# Patient Record
Sex: Male | Born: 1950 | Race: White | Hispanic: No | Marital: Married | Smoking: Never smoker
Health system: Southern US, Community
[De-identification: ages and names within clinical notes are randomized; demographics above are authoritative.]

## PROBLEM LIST (undated history)

## (undated) DIAGNOSIS — M109 Gout, unspecified: Secondary | ICD-10-CM

## (undated) DIAGNOSIS — E119 Type 2 diabetes mellitus without complications: Secondary | ICD-10-CM

## (undated) DIAGNOSIS — I10 Essential (primary) hypertension: Secondary | ICD-10-CM

## (undated) DIAGNOSIS — Z972 Presence of dental prosthetic device (complete) (partial): Secondary | ICD-10-CM

## (undated) DIAGNOSIS — E785 Hyperlipidemia, unspecified: Secondary | ICD-10-CM

## (undated) HISTORY — PX: HEMORRHOID SURGERY: SHX153

---

## 2006-06-13 ENCOUNTER — Emergency Department: Payer: Self-pay | Admitting: Emergency Medicine

## 2006-06-14 ENCOUNTER — Emergency Department: Payer: Self-pay | Admitting: Emergency Medicine

## 2006-06-15 ENCOUNTER — Inpatient Hospital Stay: Payer: Self-pay

## 2014-04-24 ENCOUNTER — Ambulatory Visit: Payer: Self-pay | Admitting: Unknown Physician Specialty

## 2014-04-26 LAB — PATHOLOGY REPORT

## 2015-01-15 ENCOUNTER — Other Ambulatory Visit: Payer: Self-pay | Admitting: Family Medicine

## 2015-01-15 DIAGNOSIS — R7402 Elevation of levels of lactic acid dehydrogenase (LDH): Secondary | ICD-10-CM

## 2015-01-15 DIAGNOSIS — R740 Nonspecific elevation of levels of transaminase and lactic acid dehydrogenase [LDH]: Principal | ICD-10-CM

## 2015-02-07 ENCOUNTER — Ambulatory Visit
Admission: RE | Admit: 2015-02-07 | Discharge: 2015-02-07 | Disposition: A | Discharge status: Home / Self Care | Payer: No Typology Code available for payment source | Source: Ambulatory Visit | Attending: Family Medicine | Admitting: Family Medicine

## 2015-02-07 DIAGNOSIS — R7401 Elevation of levels of liver transaminase levels: Secondary | ICD-10-CM

## 2015-02-07 DIAGNOSIS — E669 Obesity, unspecified: Secondary | ICD-10-CM | POA: Diagnosis present

## 2015-02-07 DIAGNOSIS — R740 Nonspecific elevation of levels of transaminase and lactic acid dehydrogenase [LDH]: Secondary | ICD-10-CM | POA: Diagnosis not present

## 2020-08-20 ENCOUNTER — Other Ambulatory Visit: Payer: Self-pay | Admitting: Family Medicine

## 2020-08-20 DIAGNOSIS — Z87891 Personal history of nicotine dependence: Secondary | ICD-10-CM

## 2020-08-30 ENCOUNTER — Ambulatory Visit
Admission: RE | Admit: 2020-08-30 | Discharge: 2020-08-30 | Disposition: A | Discharge status: Home / Self Care | Payer: Medicare (Managed Care) | Source: Ambulatory Visit | Attending: Family Medicine | Admitting: Family Medicine

## 2020-08-30 ENCOUNTER — Other Ambulatory Visit: Payer: Self-pay

## 2020-08-30 DIAGNOSIS — Z136 Encounter for screening for cardiovascular disorders: Secondary | ICD-10-CM | POA: Insufficient documentation

## 2020-08-30 DIAGNOSIS — Z87891 Personal history of nicotine dependence: Secondary | ICD-10-CM | POA: Insufficient documentation

## 2021-10-07 ENCOUNTER — Encounter: Payer: Self-pay | Admitting: Ophthalmology

## 2021-10-17 NOTE — Discharge Instructions (Signed)

## 2021-10-21 ENCOUNTER — Encounter: Payer: Self-pay | Admitting: Ophthalmology

## 2021-10-21 ENCOUNTER — Ambulatory Visit
Admission: RE | Admit: 2021-10-21 | Discharge: 2021-10-21 | Disposition: A | Discharge status: Home / Self Care | Payer: Medicare Other | Attending: Ophthalmology | Admitting: Ophthalmology

## 2021-10-21 ENCOUNTER — Ambulatory Visit: Payer: Medicare Other | Admitting: Anesthesiology

## 2021-10-21 ENCOUNTER — Other Ambulatory Visit: Payer: Self-pay

## 2021-10-21 ENCOUNTER — Encounter
Admission: RE | Disposition: A | Discharge status: Home / Self Care | Payer: Self-pay | Source: Home / Self Care | Attending: Ophthalmology

## 2021-10-21 DIAGNOSIS — I10 Essential (primary) hypertension: Secondary | ICD-10-CM | POA: Diagnosis not present

## 2021-10-21 DIAGNOSIS — E1136 Type 2 diabetes mellitus with diabetic cataract: Secondary | ICD-10-CM | POA: Diagnosis present

## 2021-10-21 DIAGNOSIS — H2511 Age-related nuclear cataract, right eye: Secondary | ICD-10-CM | POA: Diagnosis not present

## 2021-10-21 HISTORY — DX: Presence of dental prosthetic device (complete) (partial): Z97.2

## 2021-10-21 HISTORY — DX: Gout, unspecified: M10.9

## 2021-10-21 HISTORY — DX: Type 2 diabetes mellitus without complications: E11.9

## 2021-10-21 HISTORY — PX: CATARACT EXTRACTION W/PHACO: SHX586

## 2021-10-21 HISTORY — DX: Hyperlipidemia, unspecified: E78.5

## 2021-10-21 HISTORY — DX: Essential (primary) hypertension: I10

## 2021-10-21 LAB — GLUCOSE, CAPILLARY
Glucose-Capillary: 131 mg/dL — ABNORMAL HIGH (ref 70–99)
Glucose-Capillary: 159 mg/dL — ABNORMAL HIGH (ref 70–99)

## 2021-10-21 SURGERY — PHACOEMULSIFICATION, CATARACT, WITH IOL INSERTION
Anesthesia: Monitor Anesthesia Care | Site: Eye | Laterality: Right

## 2021-10-21 MED ORDER — TETRACAINE HCL 0.5 % OP SOLN
1.0000 [drp] | OPHTHALMIC | Status: DC | PRN
  Administered 2021-10-21 (×3): 1 [drp] via OPHTHALMIC

## 2021-10-21 MED ORDER — FENTANYL CITRATE (PF) 100 MCG/2ML IJ SOLN
INTRAMUSCULAR | Status: DC | PRN
Start: 2021-10-21 — End: 2021-10-21
  Administered 2021-10-21: 50 ug via INTRAVENOUS

## 2021-10-21 MED ORDER — ACETAMINOPHEN 325 MG PO TABS: 325.0000 mg | ORAL_TABLET | ORAL | Status: DC | PRN

## 2021-10-21 MED ORDER — SIGHTPATH DOSE#1 SODIUM HYALURONATE 23 MG/ML IO SOLUTION
PREFILLED_SYRINGE | INTRAOCULAR | Status: DC | PRN
  Administered 2021-10-21: 0.6 mL via INTRAOCULAR

## 2021-10-21 MED ORDER — LACTATED RINGERS IV SOLN: INTRAVENOUS | Status: DC

## 2021-10-21 MED ORDER — LIDOCAINE HCL (PF) 2 % IJ SOLN
INTRAOCULAR | Status: DC | PRN
  Administered 2021-10-21: 1 mL via INTRAOCULAR

## 2021-10-21 MED ORDER — MOXIFLOXACIN HCL 0.5 % OP SOLN
OPHTHALMIC | Status: DC | PRN
  Administered 2021-10-21: 0.2 mL via OPHTHALMIC

## 2021-10-21 MED ORDER — SIGHTPATH DOSE#1 SODIUM HYALURONATE 10 MG/ML IO SOLUTION
PREFILLED_SYRINGE | INTRAOCULAR | Status: DC | PRN
  Administered 2021-10-21: 0.85 mL via INTRAOCULAR

## 2021-10-21 MED ORDER — ARMC OPHTHALMIC DILATING DROPS
1.0000 "application " | OPHTHALMIC | Status: DC | PRN
  Administered 2021-10-21 (×3): 1 via OPHTHALMIC

## 2021-10-21 MED ORDER — ACETAMINOPHEN 160 MG/5ML PO SOLN: 325.0000 mg | ORAL | Status: DC | PRN

## 2021-10-21 MED ORDER — SIGHTPATH DOSE#1 BSS IO SOLN
INTRAOCULAR | Status: DC | PRN
  Administered 2021-10-21: 67 mL via OPHTHALMIC

## 2021-10-21 MED ORDER — SIGHTPATH DOSE#1 BSS IO SOLN
INTRAOCULAR | Status: DC | PRN
  Administered 2021-10-21: 15 mL

## 2021-10-21 MED ORDER — MIDAZOLAM HCL 2 MG/2ML IJ SOLN
INTRAMUSCULAR | Status: DC | PRN
Start: 2021-10-21 — End: 2021-10-21
  Administered 2021-10-21: 2 mg via INTRAVENOUS

## 2021-10-21 SURGICAL SUPPLY — 20 items
CANNULA ANT/CHMB 27G (MISCELLANEOUS) IMPLANT
CANNULA ANT/CHMB 27GA (MISCELLANEOUS) IMPLANT
CATARACT SUITE SIGHTPATH (MISCELLANEOUS) ×3 IMPLANT
DISSECTOR HYDRO NUCLEUS 50X22 (MISCELLANEOUS) ×3 IMPLANT
FEE CATARACT SUITE SIGHTPATH (MISCELLANEOUS) ×1 IMPLANT
GLOVE SURG GAMMEX PI TX LF 7.5 (GLOVE) ×3 IMPLANT
GLOVE SURG SYN 8.5  E (GLOVE) ×2
GLOVE SURG SYN 8.5 E (GLOVE) ×1 IMPLANT
GLOVE SURG SYN 8.5 PF PI (GLOVE) ×1 IMPLANT
LENS IOL TECNIS EYHANCE 22.5 (Intraocular Lens) ×2 IMPLANT
NDL FILTER BLUNT 18X1 1/2 (NEEDLE) ×1 IMPLANT
NEEDLE FILTER BLUNT 18X 1/2SAF (NEEDLE) ×2
NEEDLE FILTER BLUNT 18X1 1/2 (NEEDLE) ×1 IMPLANT
PACK VIT ANT 23G (MISCELLANEOUS) IMPLANT
RING MALYGIN (MISCELLANEOUS) IMPLANT
SUT ETHILON 10-0 CS-B-6CS-B-6 (SUTURE)
SUTURE EHLN 10-0 CS-B-6CS-B-6 (SUTURE) IMPLANT
SYR 3ML LL SCALE MARK (SYRINGE) ×3 IMPLANT
SYR 5ML LL (SYRINGE) ×3 IMPLANT
WATER STERILE IRR 250ML POUR (IV SOLUTION) ×3 IMPLANT

## 2021-10-21 NOTE — Op Note (Signed)
OPERATIVE NOTE  Gary Snow [id number] 10/21/2021   PREOPERATIVE DIAGNOSIS:  Nuclear sclerotic cataract right eye.  H25.11   POSTOPERATIVE DIAGNOSIS:    Nuclear sclerotic cataract right eye.     PROCEDURE:  Phacoemusification with posterior chamber intraocular lens placement of the right eye   LENS:   Implant Name Type Inv. Item Serial No. Manufacturer Lot No. LRB No. Used Action  LENS IOL TECNIS EYHANCE 22.5 - [id number] Intraocular Lens LENS IOL TECNIS EYHANCE 22.5 [id number] [employer]  Right 1 Implanted       Procedure(s) with comments: CATARACT EXTRACTION PHACO AND INTRAOCULAR LENS PLACEMENT (IOC) RIGHT DIABETIC (Right) - 1.02 0:13.4  DIB00 +22.5   ULTRASOUND TIME: 0 minutes 13 seconds.  CDE 1.02   SURGEON:  Willey Blade, MD, MPH  ANESTHESIOLOGIST: Anesthesiologist: Baxter Flattery, MD CRNA: Michaele Offer, CRNA   ANESTHESIA:  Topical with tetracaine drops augmented with 1% preservative-free intracameral lidocaine.  ESTIMATED BLOOD LOSS: less than 1 mL.   COMPLICATIONS:  None.   DESCRIPTION OF PROCEDURE:  The patient was identified in the holding room and transported to the operating room and placed in the supine position under the operating microscope.  The right eye was identified as the operative eye and it was prepped and draped in the usual sterile ophthalmic fashion.   A 1.0 millimeter clear-corneal paracentesis was made at the 10:30 position. 0.5 ml of preservative-free 1% lidocaine with epinephrine was injected into the anterior chamber.  The anterior chamber was filled with Healon 5 viscoelastic.  A 2.4 millimeter keratome was used to make a near-clear corneal incision at the 8:00 position.  A curvilinear capsulorrhexis was made with a cystotome and capsulorrhexis forceps.  Balanced salt solution was used to hydrodissect and hydrodelineate the nucleus.   Phacoemulsification was then used in stop and chop fashion to remove the lens nucleus and epinucleus.  The  remaining cortex was then removed using the irrigation and aspiration handpiece. Healon was then placed into the capsular bag to distend it for lens placement.  A lens was then injected into the capsular bag.  The remaining viscoelastic was aspirated.   Wounds were hydrated with balanced salt solution.  The anterior chamber was inflated to a physiologic pressure with balanced salt solution.   Intracameral vigamox 0.1 mL undiluted was injected into the eye and a drop placed onto the ocular surface.  No wound leaks were noted.  The patient was taken to the recovery room in stable condition without complications of anesthesia or surgery  Willey Blade 10/21/2021, 10:04 AM

## 2021-10-21 NOTE — Transfer of Care (Signed)
Immediate Anesthesia Transfer of Care Note  Patient: Gary Snow  Procedure(s) Performed: CATARACT EXTRACTION PHACO AND INTRAOCULAR LENS PLACEMENT (IOC) RIGHT DIABETIC (Right: Eye)  Patient Location: PACU  Anesthesia Type: MAC  Level of Consciousness: awake, alert  and patient cooperative  Airway and Oxygen Therapy: Patient Spontanous Breathing and Patient connected to supplemental oxygen  Post-op Assessment: Post-op Vital signs reviewed, Patient's Cardiovascular Status Stable, Respiratory Function Stable, Patent Airway and No signs of Nausea or vomiting  Post-op Vital Signs: Reviewed and stable  Complications: No notable events documented.

## 2021-10-21 NOTE — H&P (Signed)
[facility]   Primary Care Physician:  Katherina Right, MD Ophthalmologist: Dr. Willey Blade  Pre-Procedure History & Physical: HPI:  Gary Snow is a 71 y.o. male here for cataract surgery.   Past Medical History:  Diagnosis Date   Gout    Hyperlipidemia    Hypertension    Type 2 diabetes mellitus (HCC)    Wears dentures    full upper and lower    Past Surgical History:  Procedure Laterality Date   HEMORRHOID SURGERY      Prior to Admission medications   Medication Sig Start Date End Date Taking? Authorizing Provider  allopurinol (ZYLOPRIM) 300 MG tablet Take 300 mg by mouth daily.   Yes [provider]  aspirin 325 MG tablet Take 325 mg by mouth daily.   Yes [provider]  atenolol (TENORMIN) 50 MG tablet Take 50 mg by mouth 2 (two) times daily.   Yes [provider]  atorvastatin (LIPITOR) 40 MG tablet Take 40 mg by mouth daily.   Yes [provider]  empagliflozin (JARDIANCE) 10 MG TABS tablet Take by mouth daily.   Yes [provider]  insulin detemir (LEVEMIR) 100 UNIT/ML injection Inject 25 Units into the skin 2 (two) times daily.   Yes [provider]  loratadine (CLARITIN) 10 MG tablet Take 10 mg by mouth daily.   Yes [provider]  metFORMIN (GLUCOPHAGE) 500 MG tablet Take 500 mg by mouth 2 (two) times daily with a meal.   Yes [provider]  Multiple Vitamin (MULTIVITAMIN PO) Take by mouth daily.   Yes [provider]  quinapril (ACCUPRIL) 40 MG tablet Take 40 mg by mouth daily.   Yes [provider]    Allergies as of 08/30/2021   (Not on File)    History reviewed. No pertinent family history.  Social History   Socioeconomic History   Marital status: Married    Spouse name: Not on file   Number of children: Not on file   Years of education: Not on file   Highest education level: Not on file  Occupational History   Not on file  Tobacco Use   Smoking  status: Never   Smokeless tobacco: Never  Vaping Use   Vaping Use: Never used  Substance and Sexual Activity   Alcohol use: Yes    Alcohol/week: 24.0 standard drinks    Types: 24 Cans of beer per week   Drug use: Not on file   Sexual activity: Not on file  Other Topics Concern   Not on file  Social History Narrative   Not on file   Social Determinants of Health   Financial Resource Strain: Not on file  Food Insecurity: Not on file  Transportation Needs: Not on file  Physical Activity: Not on file  Stress: Not on file  Social Connections: Not on file  Intimate Partner Violence: Not on file    Review of Systems: See HPI, otherwise negative ROS  Physical Exam: BP (!) 151/92    Pulse 69    Temp 97.8 F (36.6 C) (Temporal)    Resp 18    Ht 5\' 6"  (1.676 m)    Wt 88.5 kg    SpO2 97%    BMI 31.47 kg/m  General:   Alert, cooperative in NAD Head:  Normocephalic and atraumatic. Respiratory:  Normal work of breathing. Cardiovascular:  RRR  Impression/Plan: Gary Snow is here for cataract surgery.  Risks, benefits, limitations, and alternatives regarding  cataract surgery have been reviewed with the patient.  Questions have been answered.  All parties agreeable.   Willey Blade, MD  10/21/2021, 9:36 AM

## 2021-10-21 NOTE — Anesthesia Postprocedure Evaluation (Signed)
Anesthesia Post Note  Patient: Gary Snow  Procedure(s) Performed: CATARACT EXTRACTION PHACO AND INTRAOCULAR LENS PLACEMENT (IOC) RIGHT DIABETIC (Right: Eye)     Patient location during evaluation: PACU Anesthesia Type: MAC Level of consciousness: awake and alert Pain management: pain level controlled Vital Signs Assessment: post-procedure vital signs reviewed and stable Respiratory status: spontaneous breathing, nonlabored ventilation, respiratory function stable and patient connected to nasal cannula oxygen Cardiovascular status: stable and blood pressure returned to baseline Postop Assessment: no apparent nausea or vomiting Anesthetic complications: no   No notable events documented.  Trecia Rogers

## 2021-10-21 NOTE — Anesthesia Preprocedure Evaluation (Signed)
Anesthesia Evaluation  ?Patient identified by MRN, date of birth, ID band ?Patient awake ? ? ? ?Reviewed: ?Allergy & Precautions, H&P , NPO status , Patient's Chart, lab work & pertinent test results, reviewed documented beta blocker date and time  ? ?Airway ?Mallampati: II ? ?TM Distance: >3 FB ?Neck ROM: full ? ? ? Dental ?no notable dental hx. ? ?  ?Pulmonary ?neg pulmonary ROS,  ?  ?Pulmonary exam normal ?breath sounds clear to auscultation ? ? ? ? ? ? Cardiovascular ?Exercise Tolerance: Good ?hypertension, Normal cardiovascular exam ?Rhythm:regular Rate:Normal ? ? ?  ?Neuro/Psych ?negative neurological ROS ? negative psych ROS  ? GI/Hepatic ?negative GI ROS, Neg liver ROS,   ?Endo/Other  ?diabetes, Type 2 ? Renal/GU ?negative Renal ROS  ?negative genitourinary ?  ?Musculoskeletal ? ? Abdominal ?  ?Peds ? Hematology ?negative hematology ROS ?(+)   ?Anesthesia Other Findings ? ? Reproductive/Obstetrics ?negative OB ROS ? ?  ? ? ? ? ? ? ? ? ? ? ? ? ? ?  ?  ? ? ? ? ? ? ? ? ?Anesthesia Physical ?Anesthesia Plan ? ?ASA: 2 ? ?Anesthesia Plan: MAC  ? ?Post-op Pain Management:   ? ?Induction:  ? ?PONV Risk Score and Plan:  ? ?Airway Management Planned:  ? ?Additional Equipment:  ? ?Intra-op Plan:  ? ?Post-operative Plan:  ? ?Informed Consent: I have reviewed the patients History and Physical, chart, labs and discussed the procedure including the risks, benefits and alternatives for the proposed anesthesia with the patient or authorized representative who has indicated his/her understanding and acceptance.  ? ? ? ?Dental Advisory Given ? ?Plan Discussed with: CRNA and Anesthesiologist ? ?Anesthesia Plan Comments:   ? ? ? ? ? ? ?Anesthesia Quick Evaluation ? ?

## 2021-10-22 ENCOUNTER — Encounter: Payer: Self-pay | Admitting: Ophthalmology

## 2021-10-24 ENCOUNTER — Encounter: Payer: Self-pay | Admitting: Ophthalmology

## 2021-10-31 NOTE — Discharge Instructions (Signed)

## 2021-11-04 ENCOUNTER — Ambulatory Visit: Payer: Medicare Other | Admitting: Anesthesiology

## 2021-11-04 ENCOUNTER — Other Ambulatory Visit: Payer: Self-pay

## 2021-11-04 ENCOUNTER — Ambulatory Visit
Admission: RE | Admit: 2021-11-04 | Discharge: 2021-11-04 | Disposition: A | Discharge status: Home / Self Care | Payer: Medicare Other | Attending: Ophthalmology | Admitting: Ophthalmology

## 2021-11-04 ENCOUNTER — Encounter
Admission: RE | Disposition: A | Discharge status: Home / Self Care | Payer: Self-pay | Source: Home / Self Care | Attending: Ophthalmology

## 2021-11-04 ENCOUNTER — Encounter: Payer: Self-pay | Admitting: Ophthalmology

## 2021-11-04 DIAGNOSIS — Z7984 Long term (current) use of oral hypoglycemic drugs: Secondary | ICD-10-CM | POA: Insufficient documentation

## 2021-11-04 DIAGNOSIS — I10 Essential (primary) hypertension: Secondary | ICD-10-CM | POA: Diagnosis not present

## 2021-11-04 DIAGNOSIS — M109 Gout, unspecified: Secondary | ICD-10-CM | POA: Insufficient documentation

## 2021-11-04 DIAGNOSIS — Z79899 Other long term (current) drug therapy: Secondary | ICD-10-CM | POA: Diagnosis not present

## 2021-11-04 DIAGNOSIS — E1136 Type 2 diabetes mellitus with diabetic cataract: Secondary | ICD-10-CM | POA: Diagnosis not present

## 2021-11-04 DIAGNOSIS — H2512 Age-related nuclear cataract, left eye: Secondary | ICD-10-CM | POA: Diagnosis present

## 2021-11-04 DIAGNOSIS — E785 Hyperlipidemia, unspecified: Secondary | ICD-10-CM | POA: Insufficient documentation

## 2021-11-04 DIAGNOSIS — Z794 Long term (current) use of insulin: Secondary | ICD-10-CM | POA: Insufficient documentation

## 2021-11-04 HISTORY — PX: CATARACT EXTRACTION W/PHACO: SHX586

## 2021-11-04 LAB — GLUCOSE, CAPILLARY
Glucose-Capillary: 153 mg/dL — ABNORMAL HIGH (ref 70–99)
Glucose-Capillary: 182 mg/dL — ABNORMAL HIGH (ref 70–99)

## 2021-11-04 SURGERY — PHACOEMULSIFICATION, CATARACT, WITH IOL INSERTION
Anesthesia: Monitor Anesthesia Care | Site: Eye | Laterality: Left

## 2021-11-04 MED ORDER — SIGHTPATH DOSE#1 BSS IO SOLN
INTRAOCULAR | Status: DC | PRN
  Administered 2021-11-04: 15 mL

## 2021-11-04 MED ORDER — MOXIFLOXACIN HCL 0.5 % OP SOLN
OPHTHALMIC | Status: DC | PRN
  Administered 2021-11-04: 0.2 mL via OPHTHALMIC

## 2021-11-04 MED ORDER — TETRACAINE HCL 0.5 % OP SOLN
1.0000 [drp] | OPHTHALMIC | Status: DC | PRN
  Administered 2021-11-04 (×3): 1 [drp] via OPHTHALMIC

## 2021-11-04 MED ORDER — SIGHTPATH DOSE#1 SODIUM HYALURONATE 23 MG/ML IO SOLUTION
PREFILLED_SYRINGE | INTRAOCULAR | Status: DC | PRN
  Administered 2021-11-04: 0.6 mL via INTRAOCULAR

## 2021-11-04 MED ORDER — MIDAZOLAM HCL 2 MG/2ML IJ SOLN
INTRAMUSCULAR | Status: DC | PRN
  Administered 2021-11-04: 2 mg via INTRAVENOUS

## 2021-11-04 MED ORDER — LIDOCAINE HCL (PF) 2 % IJ SOLN
INTRAOCULAR | Status: DC | PRN
  Administered 2021-11-04: 1 mL via INTRAOCULAR

## 2021-11-04 MED ORDER — EPINEPHRINE PF 1 MG/ML IJ SOLN
INTRAMUSCULAR | Status: DC | PRN
  Administered 2021-11-04: 67 mL via OPHTHALMIC

## 2021-11-04 MED ORDER — FENTANYL CITRATE (PF) 100 MCG/2ML IJ SOLN
INTRAMUSCULAR | Status: DC | PRN
  Administered 2021-11-04 (×2): 50 ug via INTRAVENOUS

## 2021-11-04 MED ORDER — LACTATED RINGERS IV SOLN: INTRAVENOUS | Status: DC

## 2021-11-04 MED ORDER — ACETAMINOPHEN 160 MG/5ML PO SOLN: 325.0000 mg | Freq: Once | ORAL | Status: DC

## 2021-11-04 MED ORDER — ACETAMINOPHEN 325 MG PO TABS: 325.0000 mg | ORAL_TABLET | Freq: Once | ORAL | Status: DC

## 2021-11-04 MED ORDER — ARMC OPHTHALMIC DILATING DROPS
1.0000 "application " | OPHTHALMIC | Status: DC | PRN
  Administered 2021-11-04 (×3): 1 via OPHTHALMIC

## 2021-11-04 MED ORDER — SIGHTPATH DOSE#1 SODIUM HYALURONATE 10 MG/ML IO SOLUTION
PREFILLED_SYRINGE | INTRAOCULAR | Status: DC | PRN
Start: 2021-11-04 — End: 2021-11-04
  Administered 2021-11-04: 0.85 mL via INTRAOCULAR

## 2021-11-04 SURGICAL SUPPLY — 14 items
CATARACT SUITE SIGHTPATH (MISCELLANEOUS) ×2 IMPLANT
DISSECTOR HYDRO NUCLEUS 50X22 (MISCELLANEOUS) ×2 IMPLANT
FEE CATARACT SUITE SIGHTPATH (MISCELLANEOUS) ×1 IMPLANT
GLOVE SURG GAMMEX PI TX LF 7.5 (GLOVE) ×2 IMPLANT
GLOVE SURG SYN 8.5  E (GLOVE) ×1
GLOVE SURG SYN 8.5 E (GLOVE) ×1 IMPLANT
GLOVE SURG SYN 8.5 PF PI (GLOVE) ×1 IMPLANT
LENS IOL TECNIS EYHANCE 22.5 (Intraocular Lens) ×1 IMPLANT
NDL FILTER BLUNT 18X1 1/2 (NEEDLE) ×1 IMPLANT
NEEDLE FILTER BLUNT 18X 1/2SAF (NEEDLE) ×1
NEEDLE FILTER BLUNT 18X1 1/2 (NEEDLE) ×1 IMPLANT
SYR 3ML LL SCALE MARK (SYRINGE) ×2 IMPLANT
SYR 5ML LL (SYRINGE) ×2 IMPLANT
WATER STERILE IRR 250ML POUR (IV SOLUTION) ×2 IMPLANT

## 2021-11-04 NOTE — H&P (Signed)
[facility]   Primary Care Physician:  Katherina Right, MD Ophthalmologist: Dr. Willey Blade  Pre-Procedure History & Physical: HPI:  Gary Snow is a 71 y.o. male here for cataract surgery.   Past Medical History:  Diagnosis Date   Gout    Hyperlipidemia    Hypertension    Type 2 diabetes mellitus (HCC)    Wears dentures    full upper and lower    Past Surgical History:  Procedure Laterality Date   CATARACT EXTRACTION W/PHACO Right 10/21/2021   Procedure: CATARACT EXTRACTION PHACO AND INTRAOCULAR LENS PLACEMENT (IOC) RIGHT DIABETIC;  Surgeon: Nevada Crane, MD;  Location: [facility];  Service: Ophthalmology;  Laterality: Right;  1.02 0:13.4   HEMORRHOID SURGERY      Prior to Admission medications   Medication Sig Start Date End Date Taking? Authorizing Provider  allopurinol (ZYLOPRIM) 300 MG tablet Take 300 mg by mouth daily.   Yes [provider]  aspirin 325 MG tablet Take 325 mg by mouth daily.   Yes [provider]  atenolol (TENORMIN) 50 MG tablet Take 50 mg by mouth 2 (two) times daily.   Yes [provider]  atorvastatin (LIPITOR) 40 MG tablet Take 40 mg by mouth daily.   Yes [provider]  empagliflozin (JARDIANCE) 10 MG TABS tablet Take by mouth daily.   Yes [provider]  insulin detemir (LEVEMIR) 100 UNIT/ML injection Inject 25 Units into the skin 2 (two) times daily.   Yes [provider]  loratadine (CLARITIN) 10 MG tablet Take 10 mg by mouth daily.   Yes [provider]  metFORMIN (GLUCOPHAGE) 500 MG tablet Take 500 mg by mouth 2 (two) times daily with a meal.   Yes [provider]  Multiple Vitamin (MULTIVITAMIN PO) Take by mouth daily.   Yes [provider]  quinapril (ACCUPRIL) 40 MG tablet Take 40 mg by mouth daily.   Yes [provider]    Allergies as of 08/30/2021   (Not on File)    History reviewed. No pertinent family  history.  Social History   Socioeconomic History   Marital status: Married    Spouse name: Not on file   Number of children: Not on file   Years of education: Not on file   Highest education level: Not on file  Occupational History   Not on file  Tobacco Use   Smoking status: Never   Smokeless tobacco: Never  Vaping Use   Vaping Use: Never used  Substance and Sexual Activity   Alcohol use: Yes    Alcohol/week: 24.0 standard drinks    Types: 24 Cans of beer per week   Drug use: Not on file   Sexual activity: Not on file  Other Topics Concern   Not on file  Social History Narrative   Not on file   Social Determinants of Health   Financial Resource Strain: Not on file  Food Insecurity: Not on file  Transportation Needs: Not on file  Physical Activity: Not on file  Stress: Not on file  Social Connections: Not on file  Intimate Partner Violence: Not on file    Review of Systems: See HPI, otherwise negative ROS  Physical Exam: BP (!) 152/78    Pulse 73    Temp (!) 97.5 F (36.4 C) (Temporal)    Ht 5\' 6"  (1.676 m)    Wt 89.2 kg    SpO2 98%    BMI 31.73 kg/m  General:  Alert, cooperative in NAD Head:  Normocephalic and atraumatic. Respiratory:  Normal work of breathing. Cardiovascular:  RRR  Impression/Plan: Gary Snow is here for cataract surgery.  Risks, benefits, limitations, and alternatives regarding cataract surgery have been reviewed with the patient.  Questions have been answered.  All parties agreeable.   Willey Blade, MD  11/04/2021, 9:34 AM

## 2021-11-04 NOTE — Transfer of Care (Signed)
Immediate Anesthesia Transfer of Care Note  Patient: Gary Snow  Procedure(s) Performed: CATARACT EXTRACTION PHACO AND INTRAOCULAR LENS PLACEMENT (IOC) LEFT DIABETIC (Left: Eye)  Patient Location: PACU  Anesthesia Type: MAC  Level of Consciousness: awake, alert  and patient cooperative  Airway and Oxygen Therapy: Patient Spontanous Breathing and Patient connected to supplemental oxygen  Post-op Assessment: Post-op Vital signs reviewed, Patient's Cardiovascular Status Stable, Respiratory Function Stable, Patent Airway and No signs of Nausea or vomiting  Post-op Vital Signs: Reviewed and stable  Complications: No notable events documented.

## 2021-11-04 NOTE — Op Note (Signed)
OPERATIVE NOTE  Vaishnav Demartin [id number] 11/04/2021   PREOPERATIVE DIAGNOSIS:  Nuclear sclerotic cataract left eye.  H25.12   POSTOPERATIVE DIAGNOSIS:    Nuclear sclerotic cataract left eye.     PROCEDURE:  Phacoemusification with posterior chamber intraocular lens placement of the left eye   LENS:   Implant Name Type Inv. Item Serial No. Manufacturer Lot No. LRB No. Used Action  LENS IOL TECNIS EYHANCE 22.5 - [id number] Intraocular Lens LENS IOL TECNIS EYHANCE 22.5 [id number] [employer]  Left 1 Implanted      Procedure(s) with comments: CATARACT EXTRACTION PHACO AND INTRAOCULAR LENS PLACEMENT (IOC) LEFT DIABETIC (Left) - Diabetic 2.90 00:24.2  DIB00 +22.5   ULTRASOUND TIME: 0 minutes 24 seconds.  CDE 2.90   SURGEON:  Willey Blade, MD, MPH   ANESTHESIA:  Topical with tetracaine drops augmented with 1% preservative-free intracameral lidocaine.  ESTIMATED BLOOD LOSS: <1 mL   COMPLICATIONS:  None.   DESCRIPTION OF PROCEDURE:  The patient was identified in the holding room and transported to the operating room and placed in the supine position under the operating microscope.  The left eye was identified as the operative eye and it was prepped and draped in the usual sterile ophthalmic fashion.   A 1.0 millimeter clear-corneal paracentesis was made at the 5:00 position. 0.5 ml of preservative-free 1% lidocaine with epinephrine was injected into the anterior chamber.  The anterior chamber was filled with Healon 5 viscoelastic.  A 2.4 millimeter keratome was used to make a near-clear corneal incision at the 2:00 position.  A curvilinear capsulorrhexis was made with a cystotome and capsulorrhexis forceps.  Balanced salt solution was used to hydrodissect and hydrodelineate the nucleus.   Phacoemulsification was then used in stop and chop fashion to remove the lens nucleus and epinucleus.  The remaining cortex was then removed using the irrigation and aspiration handpiece. Healon was then  placed into the capsular bag to distend it for lens placement.  A lens was then injected into the capsular bag.  The remaining viscoelastic was aspirated.   Wounds were hydrated with balanced salt solution.  The anterior chamber was inflated to a physiologic pressure with balanced salt solution.  Intracameral vigamox 0.1 mL undiltued was injected into the eye and a drop placed onto the ocular surface.  No wound leaks were noted.  The patient was taken to the recovery room in stable condition without complications of anesthesia or surgery  Willey Blade 11/04/2021, 9:57 AM

## 2021-11-04 NOTE — Anesthesia Postprocedure Evaluation (Signed)
Anesthesia Post Note  Patient: Gary Snow  Procedure(s) Performed: CATARACT EXTRACTION PHACO AND INTRAOCULAR LENS PLACEMENT (IOC) LEFT DIABETIC (Left: Eye)     Patient location during evaluation: PACU Anesthesia Type: MAC Level of consciousness: awake and alert and oriented Pain management: satisfactory to patient Vital Signs Assessment: post-procedure vital signs reviewed and stable Respiratory status: spontaneous breathing, nonlabored ventilation and respiratory function stable Cardiovascular status: blood pressure returned to baseline and stable Postop Assessment: Adequate PO intake and No signs of nausea or vomiting Anesthetic complications: no   No notable events documented.  Raliegh Ip

## 2021-11-04 NOTE — Anesthesia Procedure Notes (Signed)
Procedure Name: MAC Date/Time: 11/04/2021 9:43 AM Performed by: Jeannene Patella, CRNA Pre-anesthesia Checklist: Patient identified, Emergency Drugs available, Suction available, Timeout performed and Patient being monitored Patient Re-evaluated:Patient Re-evaluated prior to induction Oxygen Delivery Method: Nasal cannula Placement Confirmation: positive ETCO2

## 2021-11-04 NOTE — Anesthesia Preprocedure Evaluation (Signed)
Anesthesia Evaluation  Patient identified by MRN, date of birth, ID band Patient awake    Reviewed: Allergy & Precautions, H&P , NPO status , Patient's Chart, lab work & pertinent test results, reviewed documented beta blocker date and time   Airway Mallampati: II  TM Distance: >3 FB Neck ROM: full    Dental no notable dental hx.    Pulmonary neg pulmonary ROS,    Pulmonary exam normal breath sounds clear to auscultation       Cardiovascular Exercise Tolerance: Good hypertension, Normal cardiovascular exam Rhythm:regular Rate:Normal     Neuro/Psych negative neurological ROS  negative psych ROS   GI/Hepatic negative GI ROS, Neg liver ROS,   Endo/Other  diabetes, Type 2  Renal/GU negative Renal ROS  negative genitourinary   Musculoskeletal   Abdominal   Peds  Hematology negative hematology ROS (+)   Anesthesia Other Findings   Reproductive/Obstetrics negative OB ROS                             Anesthesia Physical  Anesthesia Plan  ASA: 2  Anesthesia Plan: MAC   Post-op Pain Management: Minimal or no pain anticipated   Induction:   PONV Risk Score and Plan: 1 and Treatment may vary due to age or medical condition, TIVA and Midazolam  Airway Management Planned:   Additional Equipment:   Intra-op Plan:   Post-operative Plan:   Informed Consent: I have reviewed the patients History and Physical, chart, labs and discussed the procedure including the risks, benefits and alternatives for the proposed anesthesia with the patient or authorized representative who has indicated his/her understanding and acceptance.     Dental Advisory Given  Plan Discussed with: CRNA  Anesthesia Plan Comments:         Anesthesia Quick Evaluation

## 2021-11-05 ENCOUNTER — Encounter: Payer: Self-pay | Admitting: Ophthalmology

## 2022-02-11 IMAGING — US US ABDOMINAL AORTA SCREENING AAA
1 series · 14 of 25 positions shown · non-contrast
Comparison: None.

CLINICAL DATA: Male between 65-75 years of age with a smoking
history.

EXAM:
US ABDOMINAL AORTA MEDICARE SCREENING
TECHNIQUE: Ultrasound examination of the abdominal aorta was performed as a
screening evaluation for abdominal aortic aneurysm.

[Series 2: us abdominal aorta screening aaa · 0.26mm/px · 14 of 27 slices shown]
[im 1/27]
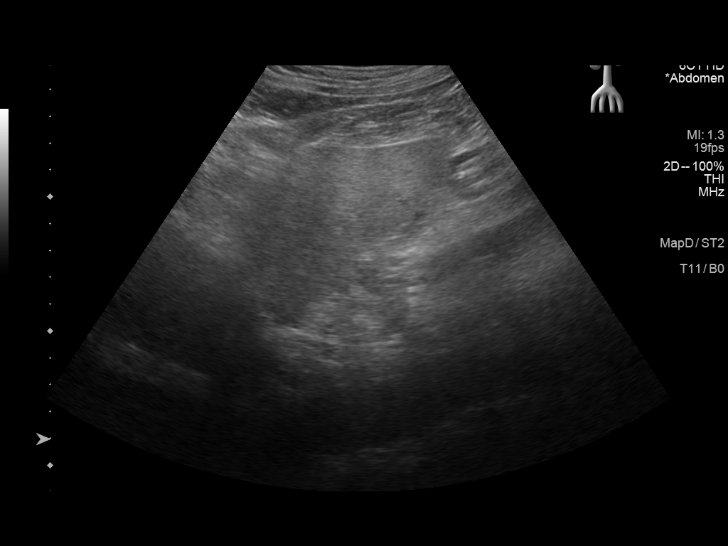
[im 3/27]
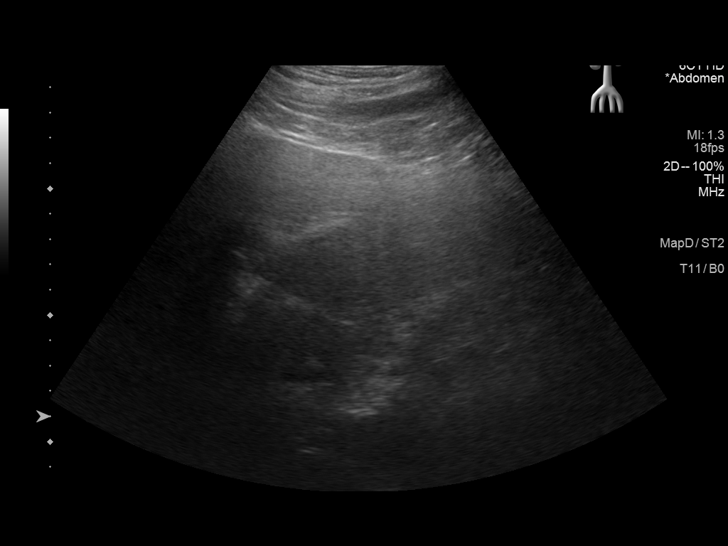
[im 5/27]
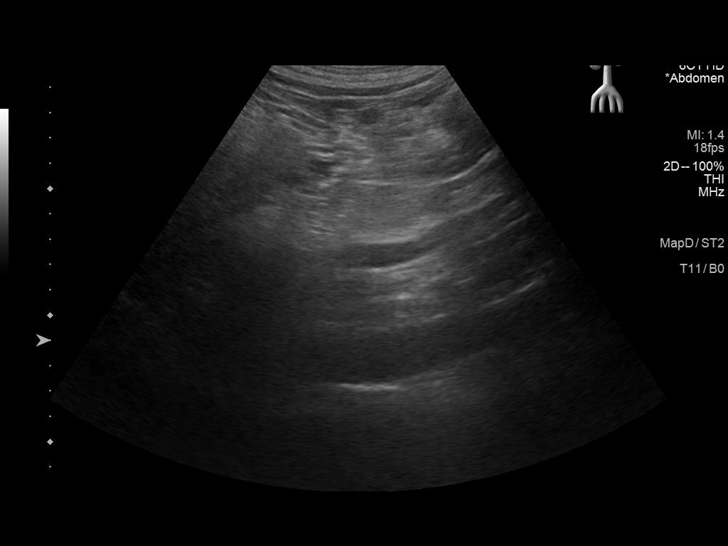
[im 7/27]
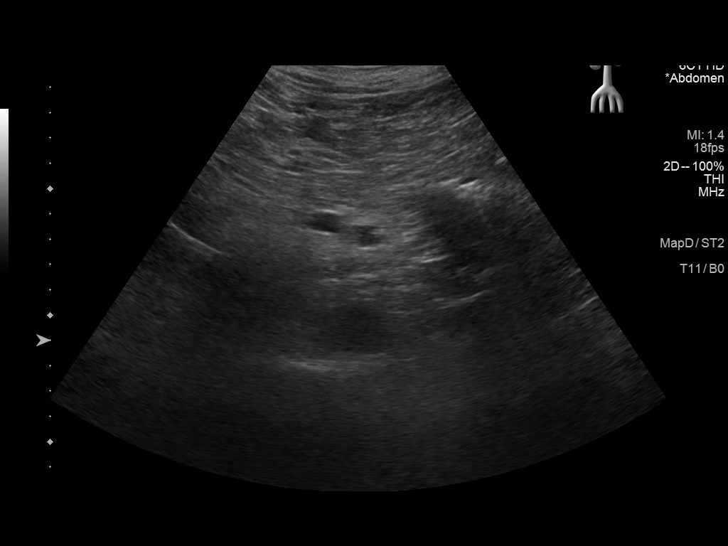
[im 9/27]
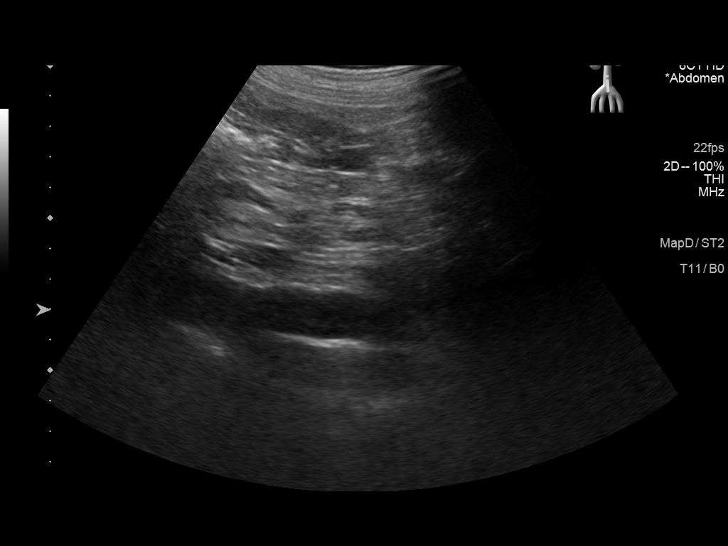
[im 10/27]
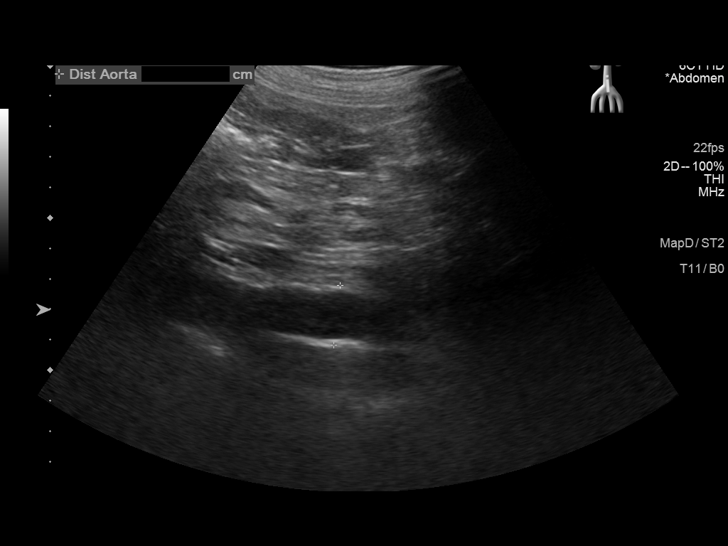
[im 12/27]
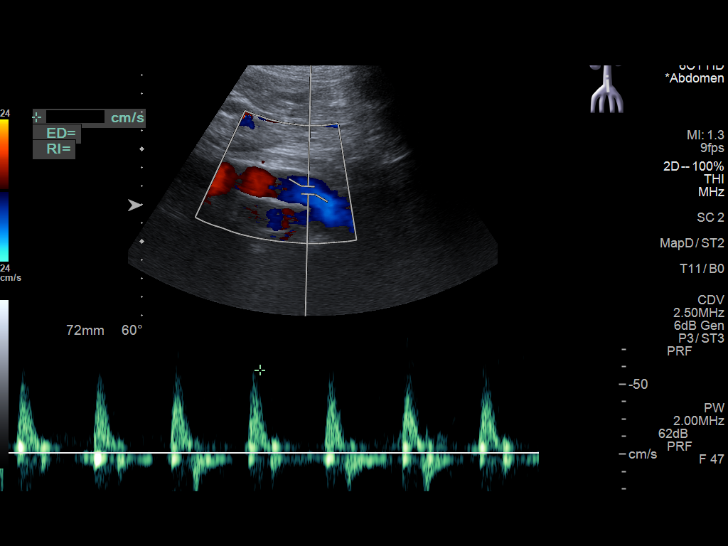
[im 15/27]
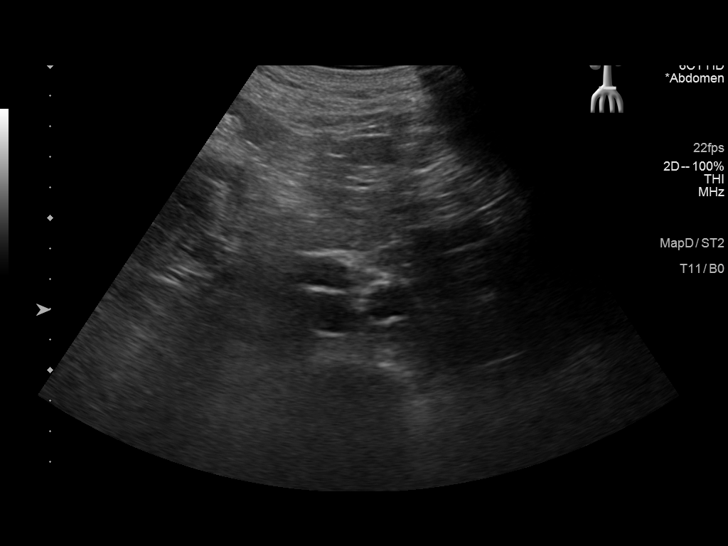
[im 17/27]
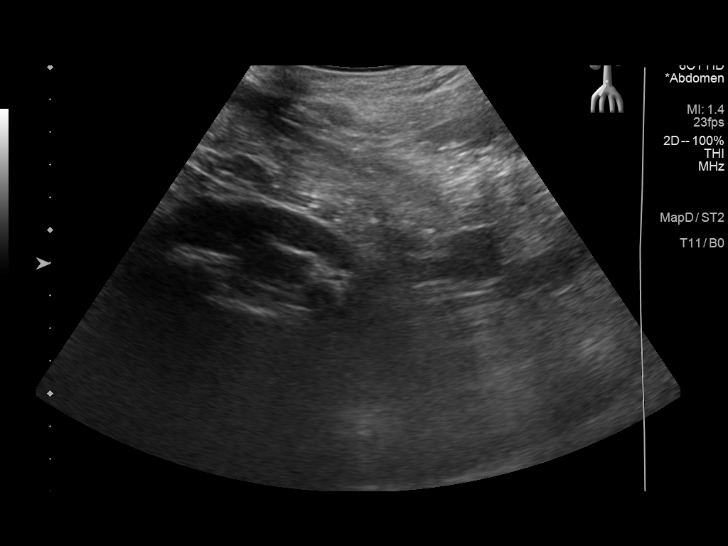
[im 18/27]
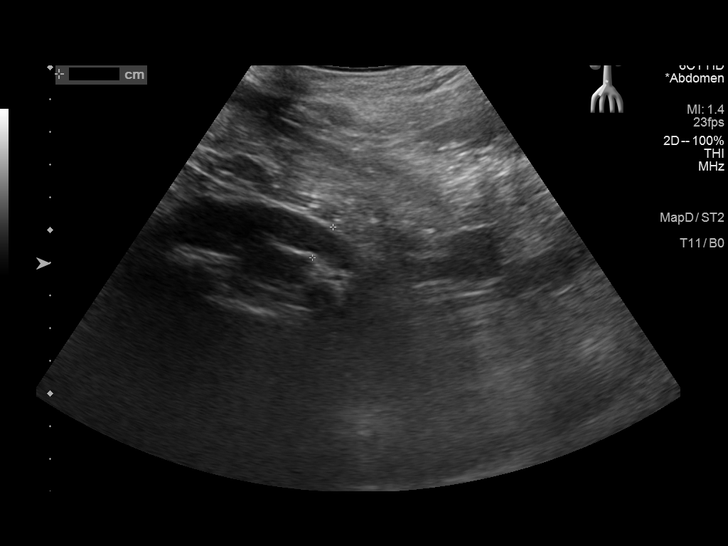
[im 20/27]
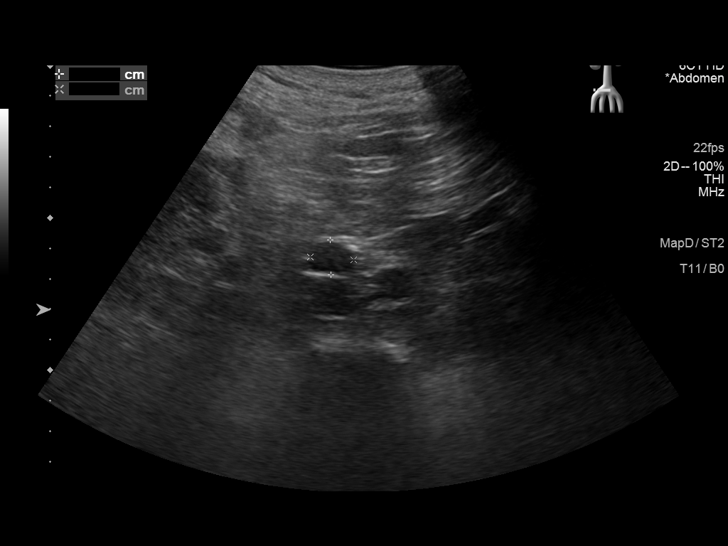
[im 22/27]
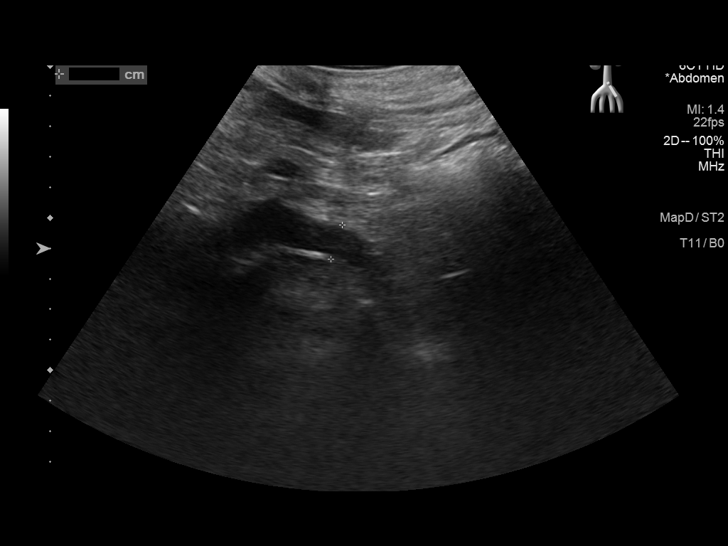
[im 24/27]
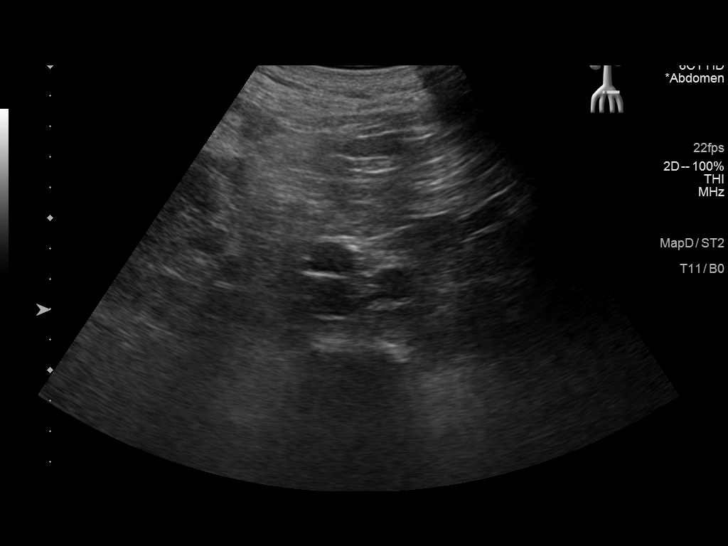
[im 27/27]
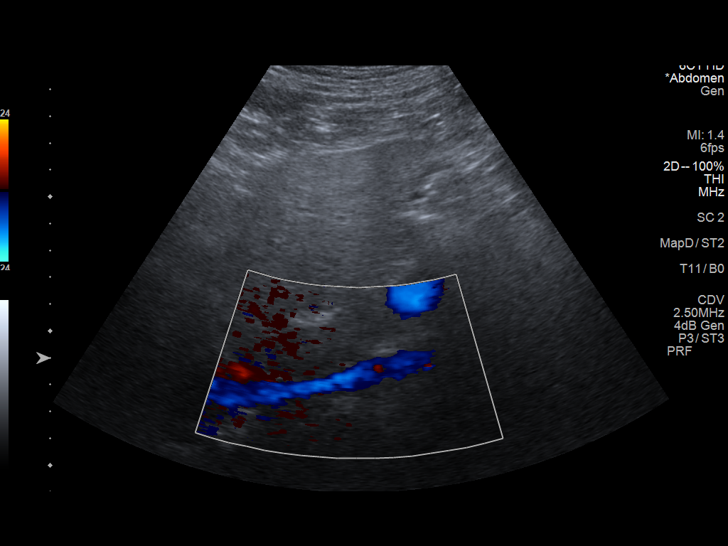

[14 of 25 positions shown; findings below may reference images not displayed]

FINDINGS: Abdominal aortic measurements as follows:

Proximal:  2.6 cm

Mid:  2.5 cm

Distal:  2.0 cm
IMPRESSION: No evidence of abdominal aortic aneurysm.
